# Patient Record
Sex: Female | Born: 1935 | Race: White | Hispanic: No | Smoking: Never smoker
Health system: Southern US, Community
[De-identification: ages and names within clinical notes are randomized; demographics above are authoritative.]

## PROBLEM LIST (undated history)

## (undated) DIAGNOSIS — M1990 Unspecified osteoarthritis, unspecified site: Secondary | ICD-10-CM

## (undated) DIAGNOSIS — IMO0002 Reserved for concepts with insufficient information to code with codable children: Secondary | ICD-10-CM

## (undated) DIAGNOSIS — I10 Essential (primary) hypertension: Secondary | ICD-10-CM

## (undated) DIAGNOSIS — K5730 Diverticulosis of large intestine without perforation or abscess without bleeding: Secondary | ICD-10-CM

## (undated) DIAGNOSIS — E785 Hyperlipidemia, unspecified: Secondary | ICD-10-CM

## (undated) DIAGNOSIS — I4891 Unspecified atrial fibrillation: Secondary | ICD-10-CM

## (undated) DIAGNOSIS — G629 Polyneuropathy, unspecified: Secondary | ICD-10-CM

## (undated) HISTORY — PX: OTHER SURGICAL HISTORY: SHX169

## (undated) HISTORY — DX: Reserved for concepts with insufficient information to code with codable children: IMO0002

## (undated) HISTORY — DX: Hyperlipidemia, unspecified: E78.5

## (undated) HISTORY — DX: Polyneuropathy, unspecified: G62.9

## (undated) HISTORY — DX: Essential (primary) hypertension: I10

## (undated) HISTORY — DX: Diverticulosis of large intestine without perforation or abscess without bleeding: K57.30

## (undated) HISTORY — DX: Unspecified atrial fibrillation: I48.91

## (undated) HISTORY — DX: Unspecified osteoarthritis, unspecified site: M19.90

---
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
## 1970-09-19 HISTORY — PX: BLADDER REPAIR: SHX76

## 1989-09-19 HISTORY — PX: SINUS SURGERY WITH INSTATRAK: SHX5215

## 2001-06-19 ENCOUNTER — Encounter: Payer: Self-pay | Admitting: Internal Medicine

## 2001-06-19 LAB — CONVERTED CEMR LAB

## 2004-07-20 ENCOUNTER — Ambulatory Visit: Payer: Self-pay | Admitting: Internal Medicine

## 2005-01-12 ENCOUNTER — Ambulatory Visit: Payer: Self-pay | Admitting: Internal Medicine

## 2005-05-27 ENCOUNTER — Ambulatory Visit: Payer: Self-pay | Admitting: Internal Medicine

## 2005-07-07 ENCOUNTER — Ambulatory Visit: Payer: Self-pay | Admitting: Internal Medicine

## 2005-10-12 ENCOUNTER — Ambulatory Visit: Payer: Self-pay | Admitting: Internal Medicine

## 2005-12-19 ENCOUNTER — Ambulatory Visit: Payer: Self-pay | Admitting: Internal Medicine

## 2006-03-30 ENCOUNTER — Ambulatory Visit: Payer: Self-pay | Admitting: Internal Medicine

## 2006-12-15 ENCOUNTER — Ambulatory Visit: Payer: Self-pay | Admitting: Family Medicine

## 2006-12-29 ENCOUNTER — Ambulatory Visit: Payer: Self-pay | Admitting: Internal Medicine

## 2007-01-11 ENCOUNTER — Ambulatory Visit: Payer: Self-pay | Admitting: Internal Medicine

## 2007-01-11 ENCOUNTER — Encounter: Payer: Self-pay | Admitting: Internal Medicine

## 2007-01-11 DIAGNOSIS — J309 Allergic rhinitis, unspecified: Secondary | ICD-10-CM | POA: Insufficient documentation

## 2007-01-11 DIAGNOSIS — I10 Essential (primary) hypertension: Secondary | ICD-10-CM | POA: Insufficient documentation

## 2007-01-11 DIAGNOSIS — E785 Hyperlipidemia, unspecified: Secondary | ICD-10-CM

## 2007-01-11 DIAGNOSIS — M5030 Other cervical disc degeneration, unspecified cervical region: Secondary | ICD-10-CM | POA: Insufficient documentation

## 2007-01-11 DIAGNOSIS — R32 Unspecified urinary incontinence: Secondary | ICD-10-CM | POA: Insufficient documentation

## 2007-01-11 DIAGNOSIS — G589 Mononeuropathy, unspecified: Secondary | ICD-10-CM | POA: Insufficient documentation

## 2007-01-12 LAB — CONVERTED CEMR LAB
BUN: 17 mg/dL (ref 6–23)
CO2: 31 meq/L (ref 19–32)
Chloride: 105 meq/L (ref 96–112)
Creatinine, Ser: 0.6 mg/dL (ref 0.4–1.2)
GFR calc non Af Amer: 106 mL/min
Phosphorus: 4.2 mg/dL (ref 2.3–4.6)

## 2007-02-14 ENCOUNTER — Ambulatory Visit: Payer: Self-pay | Admitting: Internal Medicine

## 2007-02-16 ENCOUNTER — Encounter: Payer: Self-pay | Admitting: Internal Medicine

## 2007-02-18 DIAGNOSIS — K5730 Diverticulosis of large intestine without perforation or abscess without bleeding: Secondary | ICD-10-CM | POA: Insufficient documentation

## 2007-02-23 ENCOUNTER — Ambulatory Visit: Payer: Self-pay | Admitting: Internal Medicine

## 2007-03-09 ENCOUNTER — Ambulatory Visit: Payer: Self-pay | Admitting: Internal Medicine

## 2007-07-02 ENCOUNTER — Ambulatory Visit: Payer: Self-pay | Admitting: Internal Medicine

## 2007-07-02 DIAGNOSIS — R0602 Shortness of breath: Secondary | ICD-10-CM

## 2007-07-12 ENCOUNTER — Encounter: Payer: Self-pay | Admitting: Internal Medicine

## 2007-07-12 ENCOUNTER — Ambulatory Visit: Payer: Self-pay

## 2007-10-10 ENCOUNTER — Encounter: Payer: Self-pay | Admitting: Internal Medicine

## 2007-10-23 ENCOUNTER — Ambulatory Visit: Payer: Self-pay | Admitting: Internal Medicine

## 2007-10-23 DIAGNOSIS — R42 Dizziness and giddiness: Secondary | ICD-10-CM

## 2007-10-23 DIAGNOSIS — I4891 Unspecified atrial fibrillation: Secondary | ICD-10-CM | POA: Insufficient documentation

## 2007-10-26 ENCOUNTER — Encounter: Payer: Self-pay | Admitting: Internal Medicine

## 2007-11-06 ENCOUNTER — Ambulatory Visit: Payer: Self-pay | Admitting: Internal Medicine

## 2008-06-25 ENCOUNTER — Ambulatory Visit: Payer: Self-pay | Admitting: Internal Medicine

## 2008-06-25 DIAGNOSIS — M1990 Unspecified osteoarthritis, unspecified site: Secondary | ICD-10-CM | POA: Insufficient documentation

## 2008-06-27 LAB — CONVERTED CEMR LAB
Calcium: 9.4 mg/dL (ref 8.4–10.5)
Chloride: 105 meq/L (ref 96–112)
Eosinophils Absolute: 0.3 10*3/uL (ref 0.0–0.7)
Eosinophils Relative: 4.5 % (ref 0.0–5.0)
GFR calc non Af Amer: 105 mL/min
Glucose, Bld: 94 mg/dL (ref 70–99)
HCT: 39.2 % (ref 36.0–46.0)
Monocytes Absolute: 0.8 10*3/uL (ref 0.1–1.0)
Monocytes Relative: 12.7 % — ABNORMAL HIGH (ref 3.0–12.0)
Neutrophils Relative %: 49.4 % (ref 43.0–77.0)
Phosphorus: 3.5 mg/dL (ref 2.3–4.6)
Platelets: 197 10*3/uL (ref 150–400)
Potassium: 4.5 meq/L (ref 3.5–5.1)
RDW: 12.3 % (ref 11.5–14.6)
Sodium: 141 meq/L (ref 135–145)
TSH: 2.34 microintl units/mL (ref 0.35–5.50)
WBC: 6 10*3/uL (ref 4.5–10.5)

## 2008-07-17 ENCOUNTER — Encounter: Payer: Self-pay | Admitting: Internal Medicine

## 2008-07-17 ENCOUNTER — Ambulatory Visit: Payer: Self-pay | Admitting: Internal Medicine

## 2008-09-23 ENCOUNTER — Emergency Department: Payer: Self-pay | Admitting: Emergency Medicine

## 2008-09-23 ENCOUNTER — Encounter: Payer: Self-pay | Admitting: Internal Medicine

## 2008-09-30 ENCOUNTER — Ambulatory Visit: Payer: Self-pay | Admitting: Internal Medicine

## 2008-10-22 ENCOUNTER — Ambulatory Visit: Payer: Self-pay | Admitting: Internal Medicine

## 2008-10-22 DIAGNOSIS — H5310 Unspecified subjective visual disturbances: Secondary | ICD-10-CM | POA: Insufficient documentation

## 2008-10-23 ENCOUNTER — Ambulatory Visit: Payer: Self-pay

## 2008-10-23 ENCOUNTER — Encounter: Payer: Self-pay | Admitting: Internal Medicine

## 2009-04-01 ENCOUNTER — Ambulatory Visit: Payer: Self-pay | Admitting: Internal Medicine

## 2009-04-01 DIAGNOSIS — R1013 Epigastric pain: Secondary | ICD-10-CM

## 2009-04-01 DIAGNOSIS — L570 Actinic keratosis: Secondary | ICD-10-CM

## 2009-08-19 ENCOUNTER — Ambulatory Visit: Payer: Self-pay | Admitting: Internal Medicine

## 2009-08-19 DIAGNOSIS — J069 Acute upper respiratory infection, unspecified: Secondary | ICD-10-CM | POA: Insufficient documentation

## 2009-10-14 ENCOUNTER — Ambulatory Visit: Payer: Self-pay | Admitting: Internal Medicine

## 2009-10-27 ENCOUNTER — Encounter: Payer: Self-pay | Admitting: Internal Medicine

## 2009-10-27 ENCOUNTER — Ambulatory Visit: Payer: Self-pay | Admitting: Internal Medicine

## 2009-12-02 ENCOUNTER — Ambulatory Visit: Payer: Self-pay | Admitting: Internal Medicine

## 2010-03-01 IMAGING — CT CT HEAD WITHOUT CONTRAST
2 series · 16 of 30 positions shown, 20 images · non-contrast
Comparison: none

REASON FOR EXAM: dizzy
COMMENTS:

[Series 2: without · axial · non-contrast · 0.37mm/px · z∈[+422,+542]mm · 13 of 29 slices shown, 17 images]
[im 3/29  brain]
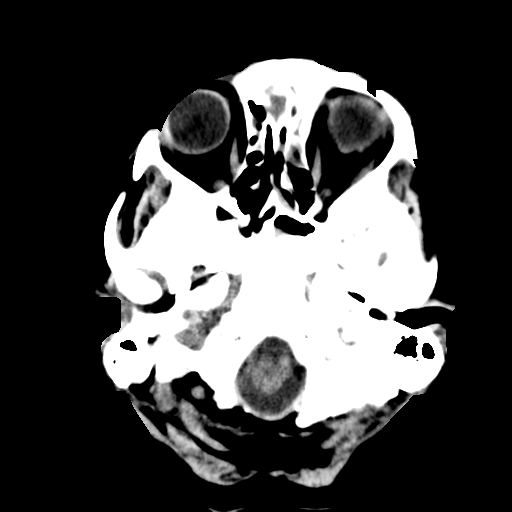
[im 3/29  bone]
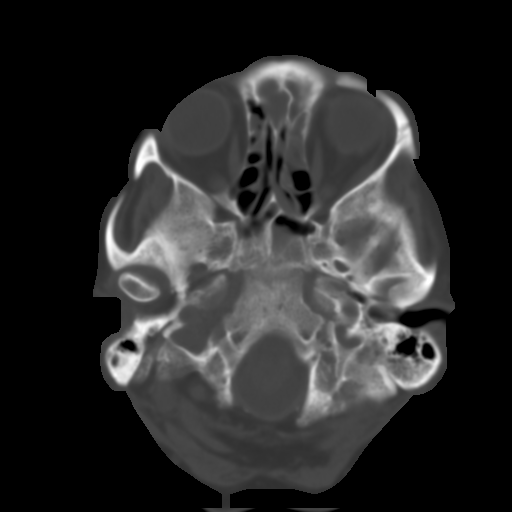
[im 5/29  brain]
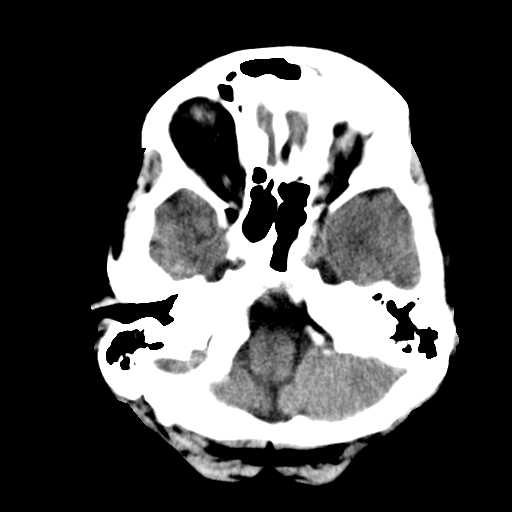
[im 7/29  brain]
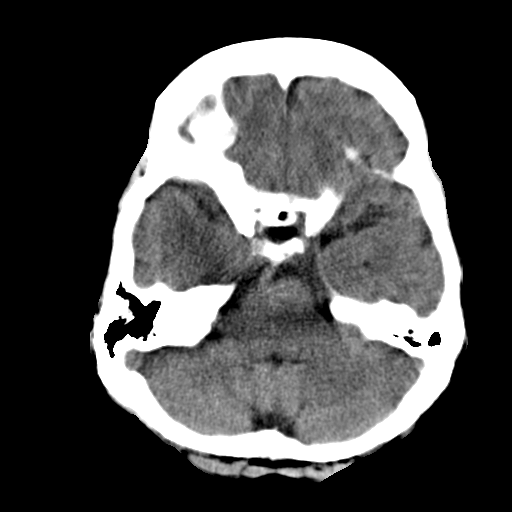
[im 9/29  brain]
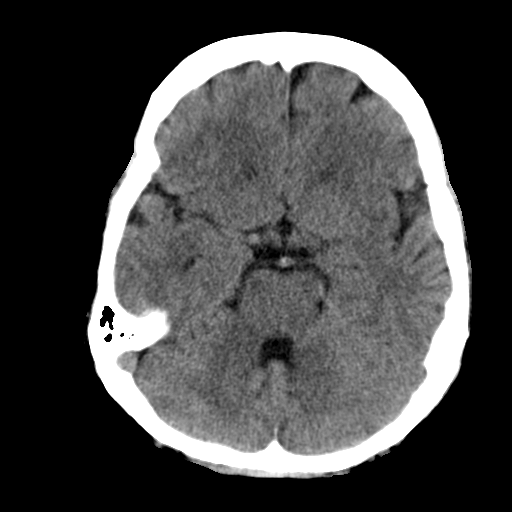
[im 11/29  brain]
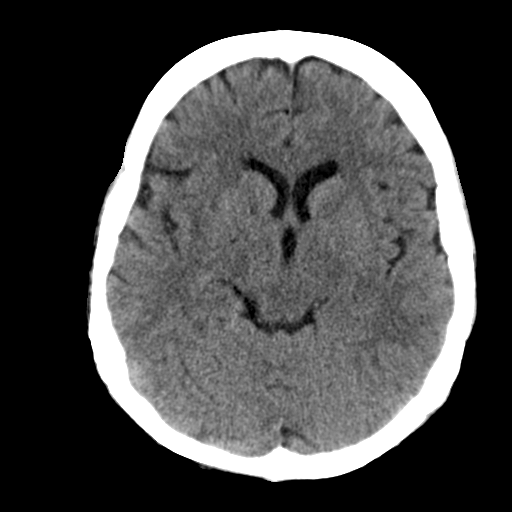
[im 11/29  bone]
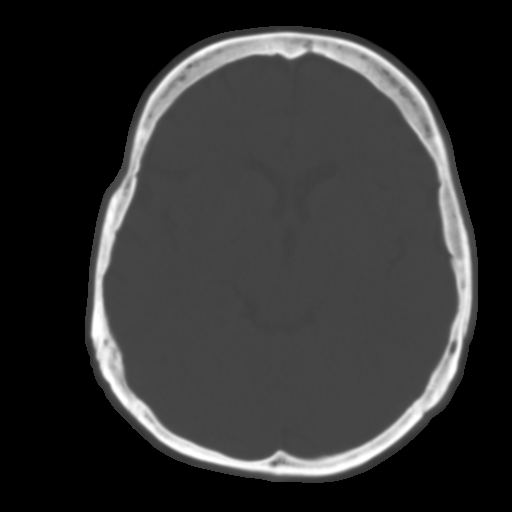
[im 13/29  brain]
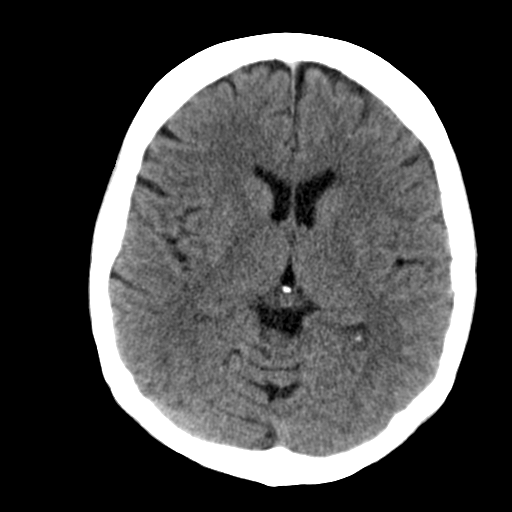
[im 15/29  brain]
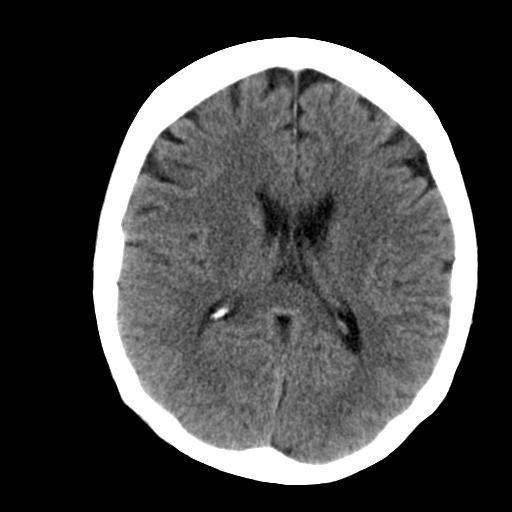
[im 17/29  brain]
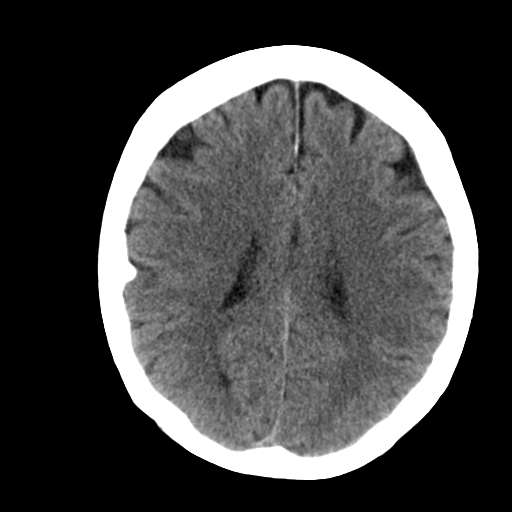
[im 19/29  brain]
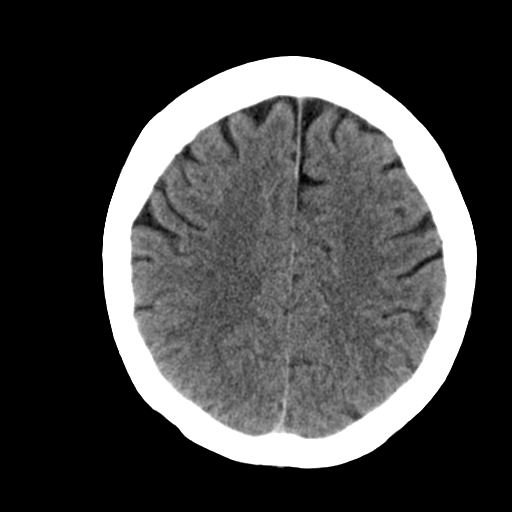
[im 19/29  bone]
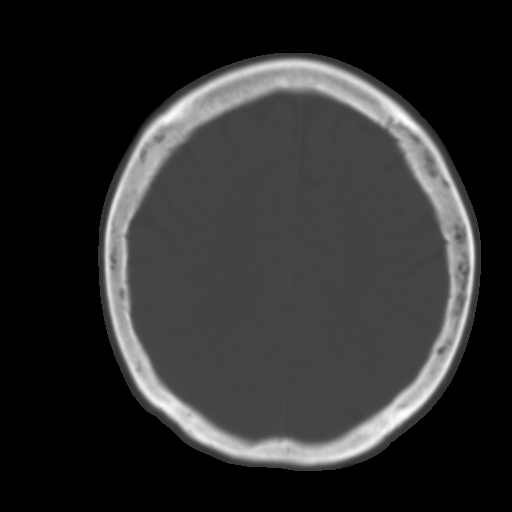
[im 21/29  brain]
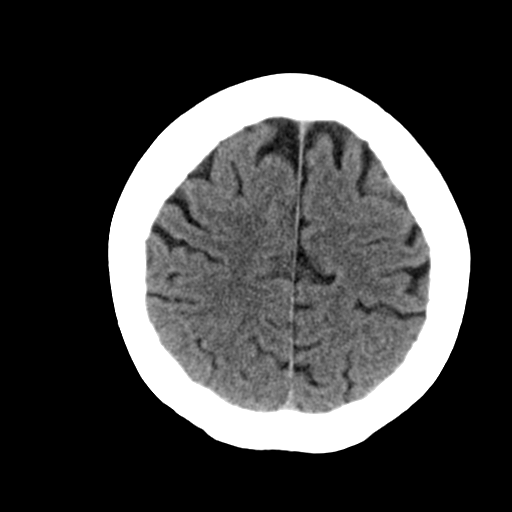
[im 23/29  brain]
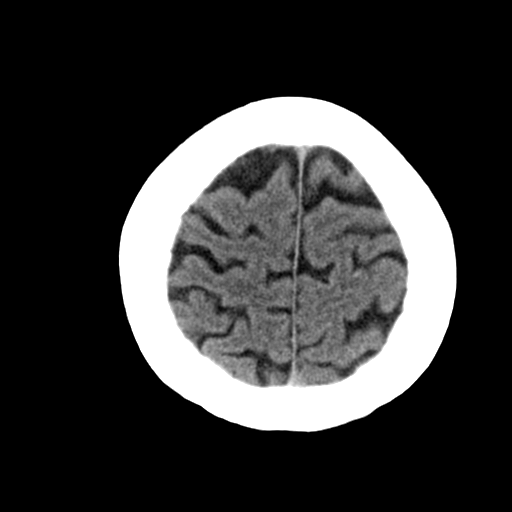
[im 25/29  brain]
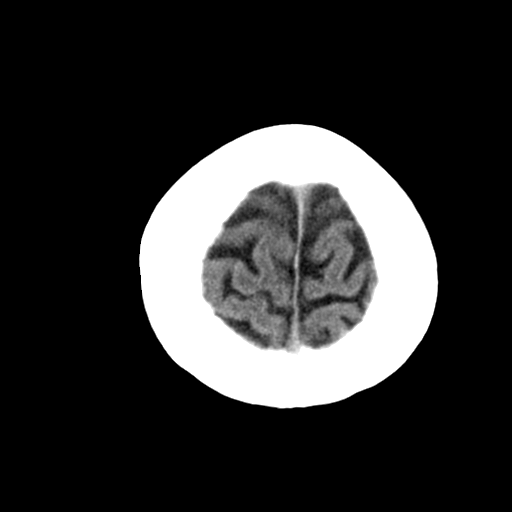
[im 27/29  brain]
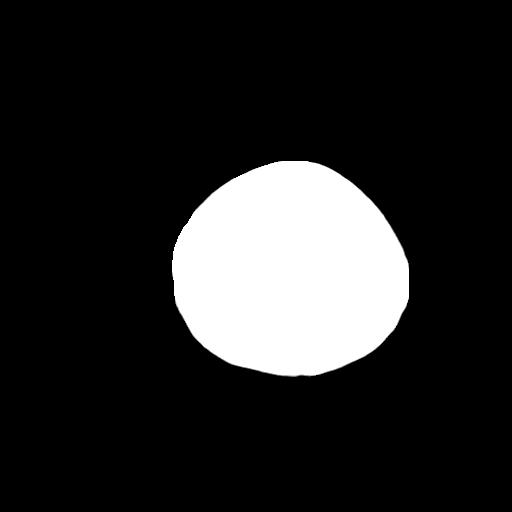
[im 27/29  bone]
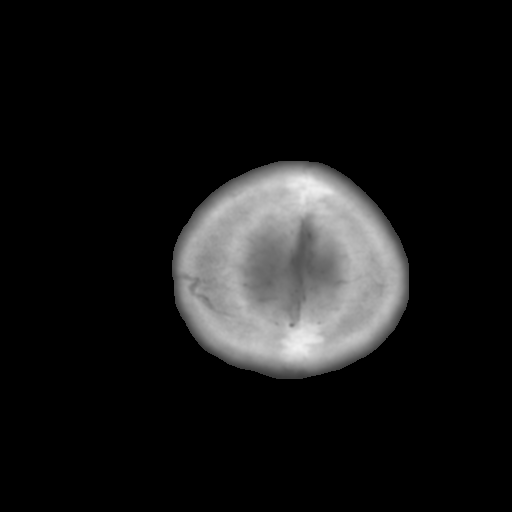

[Series 3: bone · axial · 0.37mm/px · z∈[+422,+462]mm · 3 of 29 slices shown]
[im 3/29  bone]
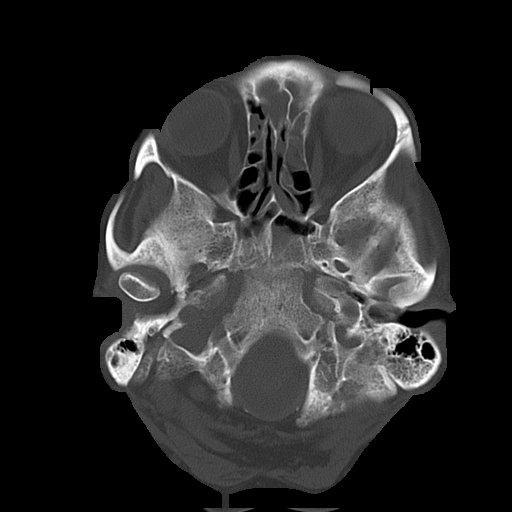
[im 7/29  bone]
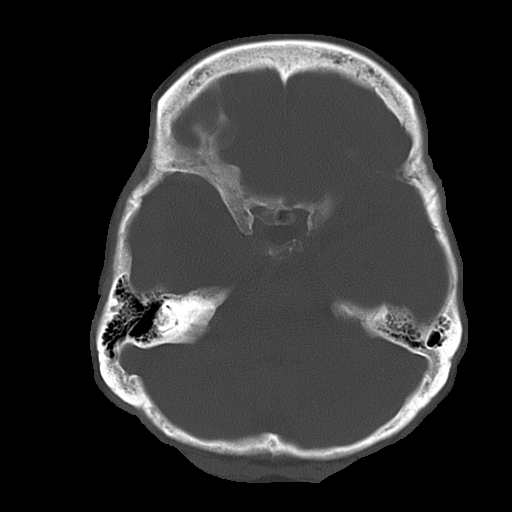
[im 11/29  bone]
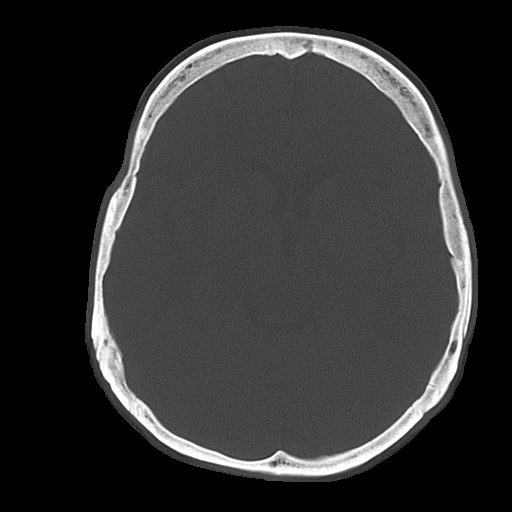

[16 of 30 positions shown; findings below may reference images not displayed]

PROCEDURE:     CT  - CT HEAD WITHOUT CONTRAST  - September 23, 2008  [DATE]

RESULT:     Noncontrast emergent CT of the brain is performed in the
standard fashion. There is no previous examination for comparison.

The ventricles and sulci are normal. There is no hemorrhage. There is no
focal mass, mass-effect or midline shift. There is no evidence of edema or
territorial infarct.There is partial opacification of the paranasal sinuses
which may represent chronic sinus disease, mucous retention cysts or
polyposis. Clinical followup is recommended. The mastoids show normal
aeration.. There is no skull fracture demonstrated.
IMPRESSION: 1. No acute intracranial abnormality.

## 2010-07-13 ENCOUNTER — Ambulatory Visit: Payer: Self-pay | Admitting: Internal Medicine

## 2010-10-17 LAB — CONVERTED CEMR LAB
AST: 24 units/L (ref 0–37)
Alkaline Phosphatase: 69 units/L (ref 39–117)
Alkaline Phosphatase: 70 units/L (ref 39–117)
BUN: 16 mg/dL (ref 6–23)
BUN: 19 mg/dL (ref 6–23)
Basophils Absolute: 0 10*3/uL (ref 0.0–0.1)
Basophils Relative: 0.6 % (ref 0.0–1.0)
Bilirubin, Direct: 0 mg/dL (ref 0.0–0.3)
Bilirubin, Direct: 0.1 mg/dL (ref 0.0–0.3)
Calcium: 9.5 mg/dL (ref 8.4–10.5)
Chloride: 104 meq/L (ref 96–112)
Creatinine, Ser: 0.7 mg/dL (ref 0.4–1.2)
Eosinophils Absolute: 0.3 10*3/uL (ref 0.0–0.6)
Eosinophils Relative: 4 % (ref 0.0–5.0)
GFR calc Af Amer: 127 mL/min
GFR calc non Af Amer: 105 mL/min
GFR calc non Af Amer: 87.58 mL/min (ref >60)
Glucose, Bld: 93 mg/dL (ref 70–99)
Hemoglobin: 12.7 g/dL (ref 12.0–15.0)
Lymphocytes Relative: 34 % (ref 12.0–46.0)
Lymphocytes Relative: 34.2 % (ref 12.0–46.0)
MCV: 91.9 fL (ref 78.0–100.0)
Monocytes Absolute: 1 10*3/uL — ABNORMAL HIGH (ref 0.2–0.7)
Monocytes Relative: 13 % — ABNORMAL HIGH (ref 3.0–12.0)
Neutro Abs: 3.1 10*3/uL (ref 1.4–7.7)
Neutrophils Relative %: 46.1 % (ref 43.0–77.0)
Neutrophils Relative %: 49.3 % (ref 43.0–77.0)
Phosphorus: 4 mg/dL (ref 2.3–4.6)
Platelets: 179 10*3/uL (ref 150–400)
Platelets: 199 10*3/uL (ref 150.0–400.0)
Potassium: 4.6 meq/L (ref 3.5–5.1)
RDW: 13.9 % (ref 11.5–14.6)
Sodium: 139 meq/L (ref 135–145)
Total Bilirubin: 0.5 mg/dL (ref 0.3–1.2)
Total Bilirubin: 0.6 mg/dL (ref 0.3–1.2)
Total Protein: 6.7 g/dL (ref 6.0–8.3)
Vitamin B-12: 503 pg/mL (ref 211–911)
WBC: 6.7 10*3/uL (ref 4.5–10.5)
WBC: 6.7 10*3/uL (ref 4.5–10.5)

## 2010-10-19 NOTE — Assessment & Plan Note (Signed)
Summary: CPX/CLE   Vital Signs:  Patient profile:   75 year old female Weight:      145 pounds Temp:     98.5 degrees F oral Pulse rate:   60 / minute Pulse rhythm:   regular BP sitting:   138 / 80  (left arm) Cuff size:   regular  Vitals Entered By: Mervin Hack CMA Duncan Dull) (October 14, 2009 11:52 AM) CC: adult physical   History of Present Illness: Had bad fall about 10 days ago Glasses cut into nose has had some brusiing around cheeks Didn't seek medical care. Son (EMT) checked her out tripped over curb Has been improving slowly  right > left leg has occ shooting pains Right toes are bad--wakes her up at night Doesn't want meds now  Got recall from [place] about carotid reviewed results --no new vision issues no need to repeat  May move to [facility] to be near family  Allergies: 1)  ! Ancef  Past History:  Past medical, surgical, family and social histories (including risk factors) reviewed for relevance to current acute and chronic problems.  Past Medical History: Hyperlipidemia Hypertension Allergic rhinitis Atrial fibrillation Cervical degenerative disk disease Diverticulosis, colon Neuropathy  Osteoarthritis  Past Surgical History: Reviewed history from 01/11/2007 and no changes required. Appendectomy--1957 Hysterectomy Sinus surgery--polyp removal--1991 Bladder repair--1972  Family History: Reviewed history from 06/25/2008 and no changes required. Dad died @88  of pneumonia--had HTN, heart problems Mom with DM, heart trouble Sister --throat cancer 4 sisters--2 with breast cancer 2 brothers CAD and stroke in sibs  Social History: Widowed 04/25/2005 children (1 died as premie) Retired from [employer] Never Smoked Alcohol use-yes--occ Regular Exercise - no Drug Use - no  Review of Systems General:  sleeps okay wears seat belt weight stable. Eyes:  Denies double vision and vision loss-1 eye; had vitreous detachment--no further  problems. ENT:  Denies decreased hearing and ringing in ears; teeth okay--regular with dentist. CV:  Denies chest pain or discomfort, difficulty breathing at night, difficulty breathing while lying down, fainting, lightheadness, palpitations, and shortness of breath with exertion; hands seem to get cold more than in past. Resp:  Denies cough and shortness of breath. GI:  Denies abdominal pain, bloody stools, change in bowel habits, dark tarry stools, indigestion, nausea, and vomiting. GU:  Complains of incontinence; denies dysuria; uses pad self breast exam benign. MS:  Denies joint pain and joint swelling. Derm:  Denies lesion(s) and rash. Neuro:  See HPI; Denies headaches, numbness, tingling, and weakness; stabbing pain in thighs and toes . Psych:  Denies anxiety and depression. Heme:  Denies abnormal bruising and enlarge lymph nodes. Allergy:  Complains of seasonal allergies and sneezing; mild seasonal symptoms--no meds needed.  Physical Exam  General:  alert and normal appearance.   Eyes:  pupils equal, pupils round, pupils reactive to light, and no optic disk abnormalities.   Ears:  R ear normal and L ear normal.   Mouth:  no erythema and no lesions.   Neck:  supple, no masses, no thyromegaly, no carotid bruits, and no cervical lymphadenopathy.   Breasts:  no abnormal thickening, no tenderness, and no adenopathy.  Mild bilateral cystic changes Lungs:  normal respiratory effort and normal breath sounds.   Heart:  normal rate, regular rhythm, no murmur, and no gallop.   Abdomen:  soft and non-tender.   Msk:  no joint tenderness and no joint swelling.   Pulses:  1+ in feet Extremities:  no edema Neurologic:  alert &  oriented X3, strength normal in all extremities, and gait normal.   Skin:  no rashes and no suspicious lesions.   Psych:  normally interactive, good eye contact, not anxious appearing, and not depressed appearing.     Impression & Recommendations:  Problem # 1:   PREVENTIVE HEALTH CARE (ICD-V70.0) Assessment Comment Only doing well needs mammo  Problem # 2:  NEUROPATHY (ICD-355.9) Assessment: Deteriorated  more foot pain will check labs gabapentin if pain worses (she wants to hold off on meds for now)  Orders: Venipuncture (09811) TLB-Renal Function Panel (80069-RENAL) TLB-CBC Platelet - w/Differential (85025-CBCD) TLB-Hepatic/Liver Function Pnl (80076-HEPATIC) TLB-TSH (Thyroid Stimulating Hormone) (84443-TSH) TLB-B12, Serum-Total ONLY (91478-G95)  Problem # 3:  ATRIAL FIBRILLATION (ICD-427.31) Assessment: Comment Only in sinus again  Her updated medication list for this problem includes:    Aspirin Ec Low Dose 81 Mg Tbec (Aspirin) .Marland Kitchen... Take one by mouth daily  Problem # 4:  URINARY INCONTINENCE (ICD-788.30) Assessment: Unchanged mostly incontinent with stress or upon standing meds dried her out and not likely to be helpful discussed Kegel's  Problem # 5:  HYPERTENSION (ICD-401.9) Assessment: Unchanged no meds needed  BP today: 138/80 Prior BP: 140/100 (08/19/2009)  Labs Reviewed: K+: 4.5 (06/25/2008) Creat: : 0.6 (06/25/2008)     Problem # 6:  OSTEOARTHRITIS (ICD-715.90) Assessment: Unchanged doing okay with this  Her updated medication list for this problem includes:    Aspirin Ec Low Dose 81 Mg Tbec (Aspirin) .Marland Kitchen... Take one by mouth daily    Tylenol Extra Strength 500 Mg Tabs (Acetaminophen) .Marland Kitchen... Take one by mouth daily as needed  Complete Medication List: 1)  Multivitamins Tabs (Multiple vitamin) .... Take 1 tablet by mouth once a day 2)  Aspirin Ec Low Dose 81 Mg Tbec (Aspirin) .... Take one by mouth daily 3)  Tylenol Extra Strength 500 Mg Tabs (Acetaminophen) .... Take one by mouth daily as needed 4)  Calcium-vitamin D 500-200 Mg-unit Tabs (Calcium carbonate-vitamin d) .... Take 1 by mouth once daily  Other Orders: Radiology Referral (Radiology)  Patient Instructions: 1)  Please schedule a follow-up  appointment in 6 months .  2)  Schedule your mammogram.   Current Allergies (reviewed today): ! ANCEF   EKG  Procedure date:  10/14/2009  Findings:      sinus bradycardia @54  Otherwise normal

## 2010-10-19 NOTE — Assessment & Plan Note (Signed)
Summary: flu shot/alc   Nurse Visit   Allergies: 1)  ! Ancef  Orders Added: 1)  Flu Vaccine 85yrs + MEDICARE PATIENTS [Q2039] 2)  Administration Flu vaccine - MCR [G0008]  Flu Vaccine Consent Questions     Do you have a history of severe allergic reactions to this vaccine? no    Any prior history of allergic reactions to egg and/or gelatin? no    Do you have a sensitivity to the preservative Thimersol? no    Do you have a past history of Guillan-Barre Syndrome? no    Do you currently have an acute febrile illness? no    Have you ever had a severe reaction to latex? no    Vaccine information given and explained to patient? yes    Are you currently pregnant? no    Lot Number:[id number]   Exp Date:03/19/2011   Site Given  Left Deltoid IM

## 2010-10-19 NOTE — Assessment & Plan Note (Signed)
Summary: COUGH X 3 WKS/CLE   Vital Signs:  Patient profile:   75 year old female Weight:      148 pounds Temp:     98.2 degrees F oral Pulse rate:   68 / minute Pulse rhythm:   regular Resp:     16 per minute BP sitting:   122 / 70  (left arm) Cuff size:   regular  Vitals Entered By: Mervin Hack CMA Duncan Dull) (December 02, 2009 11:30 AM) CC: cough x3weeks   History of Present Illness: Has bad cough for about 3 weeks or more No fever No sig nasal congestion cough has remained non productive bad both day and night Slight SOB No change in exercise tolerance  No sore throat--just some raspiness using cough drops does have some PND No ear pain  Cough is causing a lot of stress incontinence Needing depends for past 10 days  tried some cough syrup--tussin---has helped a little  Allergies: 1)  ! Ancef  Past History:  Past medical, surgical, family and social histories (including risk factors) reviewed for relevance to current acute and chronic problems.  Past Medical History: Reviewed history from 10/14/2009 and no changes required. Hyperlipidemia Hypertension Allergic rhinitis Atrial fibrillation Cervical degenerative disk disease Diverticulosis, colon Neuropathy  Osteoarthritis  Past Surgical History: Reviewed history from 01/11/2007 and no changes required. Appendectomy--1957 Hysterectomy Sinus surgery--polyp removal--1991 Bladder repair--1972  Family History: Reviewed history from 06/25/2008 and no changes required. Dad died @88  of pneumonia--had HTN, heart problems Mom with DM, heart trouble Sister --throat cancer 4 sisters--2 with breast cancer 2 brothers CAD and stroke in sibs  Social History: Reviewed history from 10/14/2009 and no changes required. Widowed 2005/05/24 children (1 died as premie) Retired from [employer] Never Smoked Alcohol use-yes--occ Regular Exercise - no Drug Use - no  Review of Systems       No N/V or diarrhea appetite is  okay Up in [place] visiting sister  Physical Exam  General:  alert.  NAD Head:  no sinus tenderness Ears:  R ear normal and L ear normal.   Nose:  moderate inflammation Mouth:  no erythema and no exudates.   Neck:  supple, no masses, and no cervical lymphadenopathy.   Lungs:  normal respiratory effort, no intercostal retractions, no accessory muscle use, normal breath sounds, no crackles, and no wheezes.     Impression & Recommendations:  Problem # 1:  COUGH (ICD-786.2) Assessment New persistent for over 3 weeks some increased PND may suggest sinusitis  will try amoxicillin cough Rx  Complete Medication List: 1)  Multivitamins Tabs (Multiple vitamin) .... Take 1 tablet by mouth once a day 2)  Aspirin Ec Low Dose 81 Mg Tbec (Aspirin) .... Take one by mouth daily 3)  Tylenol Extra Strength 500 Mg Tabs (Acetaminophen) .... Take one by mouth daily as needed 4)  Calcium-vitamin D 500-200 Mg-unit Tabs (Calcium carbonate-vitamin d) .... Take 1 by mouth once daily 5)  Amoxicillin 500 Mg Tabs (Amoxicillin) .... 2 tabs by mouth two times a day for respiratory infection 6)  Benzonatate 100 Mg Caps (Benzonatate) .Marland Kitchen.. 1-2 caps by mouth three times a day as needed for cough  Patient Instructions: 1)  Please schedule a follow-up appointment as needed .  2)  Please call if not improving by early next week Prescriptions: BENZONATATE 100 MG CAPS (BENZONATATE) 1-2 caps by mouth three times a day as needed for cough  #42 x 1   Entered and Authorized by:   Ronnette Hila  Alphonsus Sias MD   Signed by:   Cindee Salt MD on 12/02/2009   Method used:   Electronically to        [facility] [address] (retail)       [address]       [place], [place]  [address]       Ph: [phone]       Fax: [phone]   RxID:   [id number] AMOXICILLIN 500 MG TABS (AMOXICILLIN) 2 tabs by mouth two times a day for respiratory infection  #40 x 0   Entered and  Authorized by:   Cindee Salt MD   Signed by:   Cindee Salt MD on 12/02/2009   Method used:   Electronically to        [facility] [address] (retail)       [address]       [place], [place]  [address]       Ph: [phone]       Fax: [phone]   RxID:   [id number]   Current Allergies (reviewed today): ! ANCEF

## 2010-11-03 ENCOUNTER — Ambulatory Visit (INDEPENDENT_AMBULATORY_CARE_PROVIDER_SITE_OTHER): Payer: Medicare Other | Admitting: Internal Medicine

## 2010-11-03 ENCOUNTER — Encounter: Payer: Self-pay | Admitting: Internal Medicine

## 2010-11-03 DIAGNOSIS — N393 Stress incontinence (female) (male): Secondary | ICD-10-CM | POA: Insufficient documentation

## 2010-11-03 DIAGNOSIS — J019 Acute sinusitis, unspecified: Secondary | ICD-10-CM

## 2010-11-10 NOTE — Assessment & Plan Note (Signed)
Summary: ? URI x one week   Vital Signs:  Patient profile:   75 year old female Weight:      148 pounds BMI:     27.17 O2 Sat:      98 % on Room air Temp:     98.1 degrees F oral Pulse rate:   71 / minute Pulse rhythm:   regular BP sitting:   166 / 83  (left arm) Cuff size:   regular  Vitals Entered By: Mervin Hack CMA Duncan Dull) (November 03, 2010 12:51 PM)  O2 Flow:  Room air CC: CONGESTION   History of Present Illness: Has had cough and can't get over it Sort throat No fever Did have chills at start about 1 week ago  having some yellow/gray sputum Feels like it is PND Some head congestion  Some help with benzonatate but still bad cough Keeping her up at night  No ear pain Felt some SOB in past few months--not particularly different with this illness  Plans to move to [facility] by next month son lives there  Allergies: 1)  ! Ancef  Past History:  Past medical, surgical, family and social histories (including risk factors) reviewed for relevance to current acute and chronic problems.  Past Medical History: Reviewed history from 10/14/2009 and no changes required. Hyperlipidemia Hypertension Allergic rhinitis Atrial fibrillation Cervical degenerative disk disease Diverticulosis, colon Neuropathy  Osteoarthritis  Past Surgical History: Reviewed history from 01/11/2007 and no changes required. Appendectomy--1957 Hysterectomy Sinus surgery--polyp removal--1991 Bladder repair--1972  Family History: Reviewed history from 06/25/2008 and no changes required. Dad died @88  of pneumonia--had HTN, heart problems Mom with DM, heart trouble Sister --throat cancer 4 sisters--2 with breast cancer 2 brothers CAD and stroke in sibs  Social History: Reviewed history from 10/14/2009 and no changes required. Widowed 01-May-2005 children (1 died as premie) Retired from [employer] Never Smoked Alcohol use-yes--occ Regular Exercise - no Drug Use - no  Review of  Systems       appetite is off trouble with stress in continence due to the increased cough  Physical Exam  General:  alert.  NAD Head:  no sinus tendereness Ears:  R ear normal and L ear normal.   Nose:  moderate inflammation and swelling Mouth:  no exudates.  Very slight injection Neck:  supple, no masses, and no cervical lymphadenopathy.   Lungs:  normal respiratory effort, no intercostal retractions, no accessory muscle use, normal breath sounds, no crackles, and no wheezes.     Impression & Recommendations:  Problem # 1:  SINUSITIS - ACUTE-NOS (ICD-461.9) Assessment New liekly seems to be a bacterial infeciton will try the amoxil tramadol for night cough  son who is RT, plans eval for SOB in [place]  The following medications were removed from the medication list:    Benzonatate 100 Mg Caps (Benzonatate) .Marland Kitchen... 1-2 caps by mouth three times a day as needed for cough Her updated medication list for this problem includes:    Amoxicillin 500 Mg Tabs (Amoxicillin) .Marland Kitchen... 2 tabs by mouth two times a day for respiratory infection  Problem # 2:  INCONTINENCE, FEMALE STRESS (ICD-625.6) Assessment: Deteriorated discussed no meds till over illness  Complete Medication List: 1)  Multivitamins Tabs (Multiple vitamin) .... Take 1 tablet by mouth once a day 2)  Aspirin Ec Low Dose 81 Mg Tbec (Aspirin) .... Take one by mouth daily 3)  Tylenol Extra Strength 500 Mg Tabs (Acetaminophen) .... Take one by mouth daily as needed 4)  Calcium-vitamin  D 500-200 Mg-unit Tabs (Calcium carbonate-vitamin d) .... Take 1 by mouth once daily 5)  Amoxicillin 500 Mg Tabs (Amoxicillin) .... 2 tabs by mouth two times a day for respiratory infection 6)  Tramadol Hcl 50 Mg Tabs (Tramadol hcl) .Marland Kitchen.. 1-2 at bedtime as needed for severe cough  Patient Instructions: 1)  Please schedule a follow-up appointment as needed .  Prescriptions: TRAMADOL HCL 50 MG TABS (TRAMADOL HCL) 1-2 at bedtime as needed for  severe cough  #30 x 0   Entered and Authorized by:   Cindee Salt MD   Signed by:   Cindee Salt MD on 11/03/2010   Method used:   Electronically to        [facility] [address] (retail)       [address]       [place], [place]  [address]       Ph: [phone]       Fax: [phone]   RxID:   [id number] AMOXICILLIN 500 MG TABS (AMOXICILLIN) 2 tabs by mouth two times a day for respiratory infection  #40 x 0   Entered and Authorized by:   Cindee Salt MD   Signed by:   Cindee Salt MD on 11/03/2010   Method used:   Electronically to        [facility] [address] (retail)       [address]       [place], [place]  [address]       Ph: [phone]       Fax: [phone]   RxID:   [id number]    Orders Added: 1)  Est. Patient Level IV [88416]    Current Allergies (reviewed today): ! ANCEF

## 2011-01-21 ENCOUNTER — Encounter: Payer: Self-pay | Admitting: Internal Medicine

## 2011-02-01 NOTE — Letter (Signed)
November 06, 2007    Shelly Schwalbe, MD  [address] [place], [place] [address]   RE:  [name], [name]  MRN:  [id number]  /  DOB:  [birth year]   Dear Shelly Stevens:   It was a pleasure to see Shelly Stevens today in consultation because  of her episodes of dizziness and irregular heartbeat.   As you know, she is a 75 year old woman, widowed, about 2-1/2 years ago  who has five children, one of whom she was visiting in [place] when she  had recurrent problems with dizziness.   Her first episode had occurred in August of 08, the second one in 09/08  and then two while she was in [place] in January.  These episodes are  characterized by lightheadedness and gaze instability upon standing with  symptoms of dizziness relieved by sitting.  There is no dizziness  aggravated by motion of her head.  The exertion seem to make these  symptoms somewhat worse.   Because of the aforementioned events, she went to see her daughter, a  family practitioner, who ordered a Holter monitor, the cover sheet of  which we have and is reported to demonstrate episodes of atrial  fibrillation lasting 5 to 20 seconds.  There is, in addition, a 1.9  second pause.  She had rare PVCs.   Her thromboembolic risk factors are broadly negative.   She has no known coronary disease.   You ordered __________ Myoview scan that demonstrated normal left  ventricular function and no perfusion defects.   PAST MEDICAL HISTORY:  In addition to the above,is notable for  allergies, arthritis and urinary problems.   PAST SURGICAL HISTORY:  Is notable for appendectomy and hysterectomy.   SOCIAL HISTORY:  As noted previously.  She works on the floor for [employer]  She does not use cigarettes or recreational drugs.  She does use  alcohol occasionally.   CURRENT MEDICATIONS:  Include:  1. Metoprolol tartrate 25 b.i.d. started out in [place].  2. Aspirin 81 mg a day.   ALLERGIES:  SHE IS ALLERGIC TO SHELLFISH.   PHYSICAL EXAMINATION:  GENERAL:  She is an elderly Caucasian female  appearing her stated age of 70.  VITAL SIGNS:  Her blood pressure is 116/74, her pulse is 64.  HEENT EXAM:  Demonstrated no trismus or xanthoma.  NECK:  The neck veins were flat.  The carotids were brisk and full  bilaterally without bruits.  BACK:  Without kyphosis or scoliosis.  LUNGS:  Clear.  HEART SOUNDS:  Regular with an early systolic 2 to 3/6 murmur.  S1 and  S2 are both preserved.  ABDOMEN:  Soft with active bowel sounds, without midline pulsation or  hepatomegaly.  EXTREMITIES:  Femoral pulses are 2+, distal pulses were intact.  There  is no clubbing, cyanosis or edema.  NEUROLOGIC:  Grossly normal.  SKIN:  Warm and dry.   Electrocardiogram dated today demonstrated sinus rhythm at 64 with  __________ 0.42.  The axis was normal.  The electrocardiogram was normal  except for minimal ST segment depression rather than diffusely.   IMPRESSION:  1. Episodic dizziness that lasted a couple of days at a time.  2. Short bursts of a nonsustained atrial tachycardia identified by      Holter monitor.  3. Thromboembolic risk factors, largely negative.   Shelly Stevens, Shelly Stevens is having episodes of dizziness that are  orthostatic in nature.  This does not mean that it is clearly  a  positional thing, but it may be that whatever is going on elsewise may  be aggravated in the context of positional changes.  Specifically, could  she have atrial fibrillation that was sustained during these days as  opposed to intermittent days noted on the Holter monitor.  The answer is  yes.  Unfortunately, we do not know that that is so, but I would suggest  that we try to elucidate that prior to making too many more  recommendations on therapies.   To that end, I would suggest that if she has a recurrence, that she  either call and come in or call 911, and an electrocardiogram could be  obtained at that time.  She is to call us after  that.  We will not  arrange follow up in the interim as it is not clear when we might be  able to be of assistance.   Shelly Stevens, thank you very much for the consultation.    Sincerely,      Shelly Salvia, MD, [facility]  Electronically Signed    SCK/MedQ  DD: 11/06/2007  DT: 11/07/2007  Job #: [id number]

## 2011-02-01 NOTE — Assessment & Plan Note (Signed)
[facility]                                 ON-CALL NOTE   [name], [name]                   MRN:          [id number]  DATE:10/18/2008                            DOB:          05/21/1936    DATE AND TIME:  On October 18, 2008, at 11:57 a.m.   PHONE NUMBER:  [phone].   Dr.Letvak is her regular doctor and Dr. Milinda Antis on call.   CHIEF COMPLAINT:  Vision change.   The patient stated this morning when she woke up, she felt as if there  was slight shadow coming across her right eye, which quickly resolved  and then was followed by colored squiggly lines in her vision for a  short amount of time.  This is resolved and she feels fine, but she is  starting to get a very mild headache.  She has no history of TIA,  hypertension, or any other neurologic problems.  She has had vertigo in  the past, but is not having any problem with that lately.  Today, she  denies dizziness, fever, chills, and cough.  She denies speech problems,  numbness, weakness, or any other neurologic symptoms.  She is not on any  aspirin currently or any other cardiac medications, and then she wanted  to know what to do.  I asked her if she had a history of migraine in the  past, then she said no.  I instructed her that given the fact she is  feeling fine now, she can watch this, but if the symptoms return,  especially if she gets any vision loss or any other neurologic symptoms  such as speech problems, dizziness, or weakness, to get to the hospital  for evaluation.  Otherwise, to follow up with her primary care doctor  next week to check her out and check her blood pressure, and I also  instructed her to call back if she has questions or concerns.     Shelly A. Tower, MD  Electronically Signed    MAT/MedQ  DD: 10/18/2008  DT: 10/18/2008  Job #: [id number]

## 2011-03-07 ENCOUNTER — Encounter: Payer: Self-pay | Admitting: Cardiovascular Disease

## 2017-04-06 ENCOUNTER — Encounter: Payer: Self-pay | Admitting: Internal Medicine

## 2018-09-21 ENCOUNTER — Encounter: Payer: Self-pay | Admitting: Physician Assistant

## 2018-09-21 ENCOUNTER — Ambulatory Visit: Payer: Self-pay | Admitting: Physician Assistant

## 2018-09-21 VITALS — BP 140/80 | HR 72 | Temp 98.1°F | Resp 16 | Ht 61.0 in | Wt 145.0 lb

## 2018-09-21 DIAGNOSIS — R300 Dysuria: Secondary | ICD-10-CM

## 2018-09-21 LAB — POCT URINALYSIS DIPSTICK
Bilirubin, UA: 0.5
Blood, UA: 5
GLUCOSE UA: NEGATIVE
Ketones, UA: NEGATIVE
Nitrite, UA: POSITIVE
Protein, UA: POSITIVE — AB
Spec Grav, UA: 1.015 (ref 1.010–1.025)
Urobilinogen, UA: 1 E.U./dL
pH, UA: 5.5 (ref 5.0–8.0)

## 2018-09-21 MED ORDER — NITROFURANTOIN MONOHYD MACRO 100 MG PO CAPS: 100.0000 mg | ORAL_CAPSULE | Freq: Two times a day (BID) | ORAL | 0 refills | Status: AC

## 2018-09-21 NOTE — Patient Instructions (Signed)
Your results indicate you have a UTI. I have given you a prescription for an antibiotic. Please take with food. We do not obtain urine cultures at our office; therefore, if your symptoms worsen over the next 24-48 hours or you develop fever, chills, flank pain, nausea and vomiting, please seek care immediately.    Use caution when taking antibiotic as it can increase risk of falls in the elderly. If you start to notice unsteady gait or any other issues, please stop medication immediately.   Thank you for letting me participate in your health and well being.  Urinary Tract Infection, Adult A urinary tract infection (UTI) is an infection of any part of the urinary tract. The urinary tract includes:  The kidneys.  The ureters.  The bladder.  The urethra. These organs make, store, and get rid of pee (urine) in the body. What are the causes? This is caused by germs (bacteria) in your genital area. These germs grow and cause swelling (inflammation) of your urinary tract. What increases the risk? You are more likely to develop this condition if:  You have a small, thin tube (catheter) to drain pee.  You cannot control when you pee or poop (incontinence).  You are female, and: ? You use these methods to prevent pregnancy: ? A medicine that kills sperm (spermicide). ? A device that blocks sperm (diaphragm). ? You have low levels of a female hormone (estrogen). ? You are pregnant.  You have genes that add to your risk.  You are sexually active.  You take antibiotic medicines.  You have trouble peeing because of: ? A prostate that is bigger than normal, if you are female. ? A blockage in the part of your body that drains pee from the bladder (urethra). ? A kidney stone. ? A nerve condition that affects your bladder (neurogenic bladder). ? Not getting enough to drink. ? Not peeing often enough.  You have other conditions, such as: ? Diabetes. ? A weak disease-fighting system  (immune system). ? Sickle cell disease. ? Gout. ? Injury of the spine. What are the signs or symptoms? Symptoms of this condition include:  Needing to pee right away (urgently).  Peeing often.  Peeing small amounts often.  Pain or burning when peeing.  Blood in the pee.  Pee that smells bad or not like normal.  Trouble peeing.  Pee that is cloudy.  Fluid coming from the vagina, if you are female.  Pain in the belly or lower back. Other symptoms include:  Throwing up (vomiting).  No urge to eat.  Feeling mixed up (confused).  Being tired and grouchy (irritable).  A fever.  Watery poop (diarrhea). How is this treated? This condition may be treated with:  Antibiotic medicine.  Other medicines.  Drinking enough water. Follow these instructions at home:  Medicines  Take over-the-counter and prescription medicines only as told by your doctor.  If you were prescribed an antibiotic medicine, take it as told by your doctor. Do not stop taking it even if you start to feel better. General instructions  Make sure you: ? Pee until your bladder is empty. ? Do not hold pee for a long time. ? Empty your bladder after sex. ? Wipe from front to back after pooping if you are a female. Use each tissue one time when you wipe.  Drink enough fluid to keep your pee pale yellow.  Keep all follow-up visits as told by your doctor. This is important. Contact a doctor if:  You do not get better after 1-2 days.  Your symptoms go away and then come back. Get help right away if:  You have very bad back pain.  You have very bad pain in your lower belly.  You have a fever.  You are sick to your stomach (nauseous).  You are throwing up. Summary  A urinary tract infection (UTI) is an infection of any part of the urinary tract.  This condition is caused by germs in your genital area.  There are many risk factors for a UTI. These include having a small, thin tube to  drain pee and not being able to control when you pee or poop.  Treatment includes antibiotic medicines for germs.  Drink enough fluid to keep your pee pale yellow. This information is not intended to replace advice given to you by your health care provider. Make sure you discuss any questions you have with your health care provider. Document Released: 02/22/2008 Document Revised: 03/15/2018 Document Reviewed: 03/15/2018 Elsevier Interactive Patient Education  2019 [employer].

## 2018-09-21 NOTE — Progress Notes (Signed)
09/21/2018 at 3:12 PM  Shelly Pellegriniorothy J Stevens / DOB: 09-23-1935 / MRN: [id number]  SUBJECTIVE  Shelly PellegriniDorothy J Stevens is a [age] y.o. female who complains of dysuria x 2 days. Denies  hematuria, urinary frequency, urinary urgency, flank pain, abdominal pain, pelvic pain, cloudy malordorous urine, genital rash, genital irritation and vaginal discharge.  Has tried Azo with some relief.  Most recent UTI prior to this was years ago.[place] Leaves in 3 days.    She  has a past medical history of Allergic rhinitis, Atrial fibrillation (HCC), Degenerative disk disease, Diverticulosis of colon, HTN (hypertension), Hyperlipidemia, Neuropathy, and Osteoarthritis.    Medications reviewed and updated by myself where necessary, and exist elsewhere in the encounter.   Shelly Stevens is allergic to cefazolin. She  reports that she has never smoked. She has never used smokeless tobacco. She reports current alcohol use. She reports that she does not use drugs. She  has no history on file for sexual activity. The patient  has a past surgical history that includes Appendectomy ([id number]); hysterectomy-unspecified area; Sinus surgery with [facility] (1991); and Bladder repair (1972).  Her family history is not on file.  Review of Systems  Constitutional: Negative for chills, diaphoresis and fever.  Gastrointestinal: Negative for nausea and vomiting.    OBJECTIVE  Her  height is 5\' 1"  (1.549 m) and weight is 145 lb (65.8 kg). Her temperature is 98.1 F (36.7 C). Her blood pressure is 140/80 and her pulse is 72. Her respiration is 16 and oxygen saturation is 98%.  The patient's body mass index is 27.4 kg/m.  Physical Exam Vitals signs reviewed.  Constitutional:      General: She is not in acute distress.    Appearance: Normal appearance. She is well-developed.  HENT:     Head: Normocephalic and atraumatic.  Eyes:     Conjunctiva/sclera: Conjunctivae normal.  Neck:     Musculoskeletal: Normal range of  motion.  Pulmonary:     Effort: Pulmonary effort is normal.  Abdominal:     Palpations: Abdomen is soft.     Tenderness: There is no abdominal tenderness. There is no right CVA tenderness, left CVA tenderness, guarding or rebound.  Skin:    General: Skin is warm and dry.  Neurological:     Mental Status: She is alert and oriented to person, place, and time.     Results for orders placed or performed in visit on 09/21/18 (from the past 24 hour(s))  POCT Urinalysis Dipstick     Status: Abnormal   Collection Time: 09/21/18  1:33 PM  Result Value Ref Range   Color, UA dark yellow    Clarity, UA clear    Glucose, UA Negative Negative   Bilirubin, UA 0.5    Ketones, UA neg    Spec Grav, UA 1.015 1.010 - 1.025   Blood, UA 5    pH, UA 5.5 5.0 - 8.0   Protein, UA Positive (A) Negative   Urobilinogen, UA 1.0 0.2 or 1.0 E.U./dL   Nitrite, UA pos    Leukocytes, UA Moderate (2+) (A) Negative   Appearance     Odor      ASSESSMENT & PLAN  Shelly Stevens was seen today for urinary tract infection.  Diagnoses and all orders for this visit:  Dysuria -     POCT Urinalysis Dipstick -     nitrofurantoin, macrocrystal-monohydrate, (MACROBID) 100 MG capsule; Take 1 capsule (100 mg total) by mouth 2 (two) times daily for  5 days.   Pt is overall well appearing, NAD. UA w/ +nitrites, leukocytes, and blood. Hx and UA suggestive of UTI. No concern for complicated UTI or pyelonephritis at this time.  Due to reported allergies, cannot use Keflex. Both macrobid and bactrim are on [employer] but prefer macrobid as pt reports normal kidney function and does take ARB for HTN.   Exam findings, diagnosis etiology, medication use, indications, side effects, risks, benefits, and alternatives of the medications and treatment plan prescribed today and reviewed with patient. Follow- Up and discharge instructions provided. No emergent/urgent issues found on exam. Patient education was provided. Patient verbalized  understanding of information provided and agrees with plan of care (POC), all questions answered. No barriers to understanding were identified. Red flags discussed in detail. The patient is advised to f/u with PCP if condition does not see an improvement in symptoms, or to seek the care of the closest emergency department if condition worsens with the above plan.     Benjiman Core, Cordelia Poche  [facility] 09/21/2018 3:12 PM

## 2018-09-24 ENCOUNTER — Telehealth: Payer: Self-pay | Admitting: Emergency Medicine

## 2018-09-24 NOTE — Telephone Encounter (Signed)
Spoke to patient whom stated she is doing much better. This was a follow up call from visit with [facility].
